# Patient Record
Sex: Female | Born: 1985 | Race: White | Hispanic: Yes | Marital: Married | Smoking: Never smoker
Health system: Southern US, Community
[De-identification: ages and names within clinical notes are randomized; demographics above are authoritative.]

## PROBLEM LIST (undated history)

## (undated) DIAGNOSIS — I10 Essential (primary) hypertension: Secondary | ICD-10-CM

## (undated) DIAGNOSIS — J45909 Unspecified asthma, uncomplicated: Secondary | ICD-10-CM

## (undated) HISTORY — PX: TONSILLECTOMY: SUR1361

## (undated) SURGICAL SUPPLY — 38 items
ADHESIVE MASTISOL STRL (MISCELLANEOUS) ×2
APPLIER CLIP ROT 10 11.4 M/L (STAPLE) ×3
BLADE SURG SZ11 CARB STEEL (BLADE) ×2
CANISTER SUCT 1200ML W/VALVE (MISCELLANEOUS) ×2
CATH CHOLANGI 4FR 420404F (CATHETERS)
CHLORAPREP W/TINT 26ML (MISCELLANEOUS) ×2
CLOSURE WOUND 1/2 X4 (GAUZE/BANDAGES/DRESSINGS) ×1
CONRAY 60ML FOR OR (MISCELLANEOUS)
DRAPE C-ARM XRAY 36X54 (DRAPES)
ELECT REM PT RETURN 9FT ADLT (ELECTROSURGICAL) ×3
ENDOPOUCH RETRIEVER 10 (MISCELLANEOUS) ×2
GLOVE BIO SURGEON STRL SZ8 (GLOVE) ×4
GLOVE EXAM NITRILE PF MED BLUE (GLOVE) ×4
GLOVE PROTEXIS LATEX SZ 7.5 (GLOVE) ×3 IMPLANT
GOWN STRL REUS W/TWL LRG LVL3 (GOWN DISPOSABLE) ×6
IRRIGATOR STRYKERFLOW (MISCELLANEOUS) ×3
IV CATH ANGIO 12GX3 LT BLUE (NEEDLE)
IV NS 1000ML (IV SOLUTION)
JACKSON PRATT 10 (INSTRUMENTS)
KIT RM TURNOVER STRD PROC AR (KITS) ×2
LABEL OR SOLS (LABEL) ×2
NDL SAFETY 22GX1.5 (NEEDLE) ×2
NEEDLE VERESS 14GA 120MM (NEEDLE) ×2
NS IRRIG 500ML POUR BTL (IV SOLUTION) ×2
PACK LAP CHOLECYSTECTOMY (MISCELLANEOUS) ×2
SCISSORS METZENBAUM CVD 33 (INSTRUMENTS) ×2
SLEEVE ENDOPATH XCEL 5M (ENDOMECHANICALS) ×4
SPONGE EXCIL AMD DRAIN 4X4 6P (MISCELLANEOUS)
SPONGE LAP 18X18 5 PK (GAUZE/BANDAGES/DRESSINGS)
SPONGE VERSALON 2X2 STRL (MISCELLANEOUS) ×8
STRIP CLOSURE SKIN 1/2X4 (GAUZE/BANDAGES/DRESSINGS) ×1
SUT MNCRL 4-0 (SUTURE) ×2
SUT MNCRL 4-0 27XMFL (SUTURE) ×1
SUT VICRYL 0 AB UR-6 (SUTURE) ×2
SYR 20CC LL (SYRINGE) ×2
TROCAR XCEL NON-BLD 11X100MML (ENDOMECHANICALS) ×2
TROCAR XCEL NON-BLD 5MMX100MML (ENDOMECHANICALS) ×2
TUBING INSUFFLATOR HI FLOW (MISCELLANEOUS) ×2

## (undated) SURGICAL SUPPLY — 24 items
BLADE SURG SZ11 CARB STEEL (BLADE) ×2
CHLORAPREP W/TINT 26ML (MISCELLANEOUS) ×2
DRAPE LEGGINS SURG 28X43 STRL (DRAPES) ×2
DRAPE UNDER BUTTOCK W/FLU (DRAPES) ×2
DRSG TEGADERM 2-3/8X2-3/4 SM (GAUZE/BANDAGES/DRESSINGS) ×4
GLOVE PI ORTHOPRO 6.5 (GLOVE) ×2
GLOVE SURG SYN 6.5 ES PF (GLOVE) ×3 IMPLANT
GOWN STRL REUS W/TWL LRG LVL3 (GOWN DISPOSABLE) ×2
GOWN STRL REUS W/TWL XL LVL3 (GOWN DISPOSABLE) ×2
LABEL OR SOLS (LABEL) ×2
LIGASURE BLUNT 5MM 37CM (INSTRUMENTS) ×2
LIQUID BAND (GAUZE/BANDAGES/DRESSINGS) ×2
NS IRRIG 500ML POUR BTL (IV SOLUTION) ×2
PACK LAP CHOLECYSTECTOMY (MISCELLANEOUS) ×2
PAD OB MATERNITY 4.3X12.25 (PERSONAL CARE ITEMS) ×2
PAD PREP 24X41 OB/GYN DISP (PERSONAL CARE ITEMS) ×4
SLEEVE ENDOPATH XCEL 5M (ENDOMECHANICALS) ×2
SPONGE VERSALON 2X2 STRL (MISCELLANEOUS) ×2
STRAP SAFETY BODY (MISCELLANEOUS) ×2
SUT MNCRL 4-0 (SUTURE) ×2
SUT MNCRL 4-0 27XMFL (SUTURE) ×1
SUT VIC AB 2-0 UR6 27 (SUTURE) ×2
TROCAR XCEL NON-BLD 5MMX100MML (ENDOMECHANICALS) ×2
TUBING INSUFFLATOR HI FLOW (MISCELLANEOUS) ×2

---

## 2009-01-03 IMAGING — CT CT ABD-PELV W/ CM
1 of 2 series · 16 of 32 positions shown, 20 images · IV contrast (agent unspecified)
Comparison: none

REASON FOR EXAM: RLQ pain
COMMENTS:

PROCEDURE:     CT  - CT ABDOMEN / PELVIS  W  - May 27, 2007  [DATE]
RESULT:     Axial images from the hemidiaphragms to the pubic symphysis are
obtained with contrast.

[Series 2: soft tissue · axial · 0.59mm/px · z∈[-457,-28]mm · 16 of 155 slices shown, 20 images]
[im 6/155  soft-tissue]
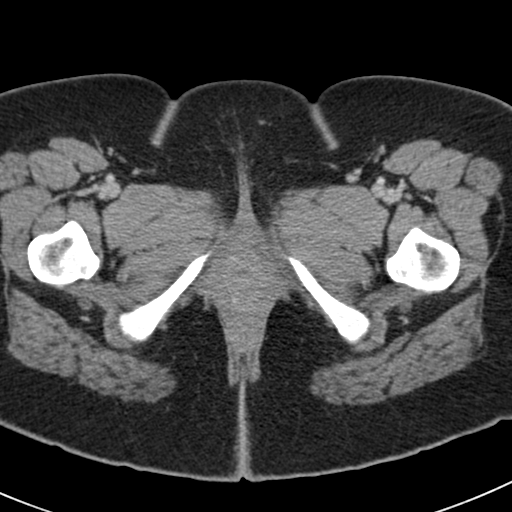
[im 6/155  bone]
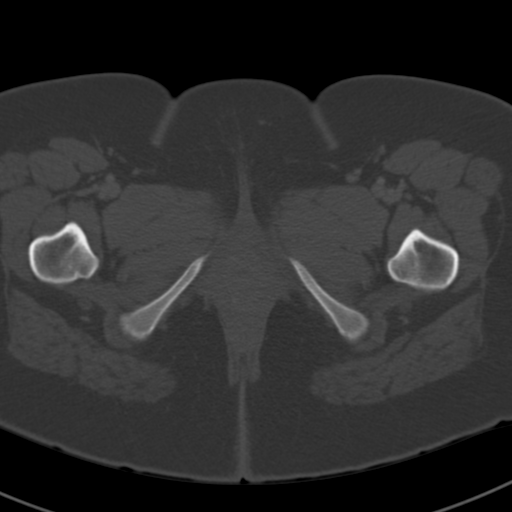
[im 18/155  soft-tissue]
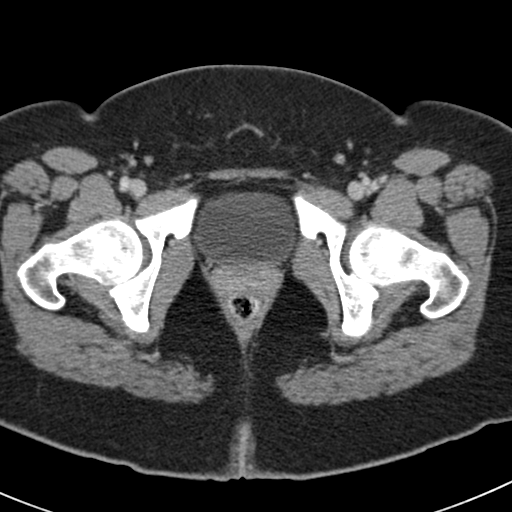
[im 29/155  soft-tissue]
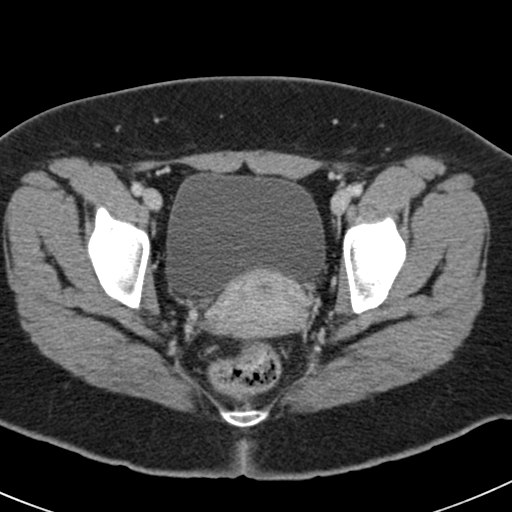
[im 40/155  soft-tissue]
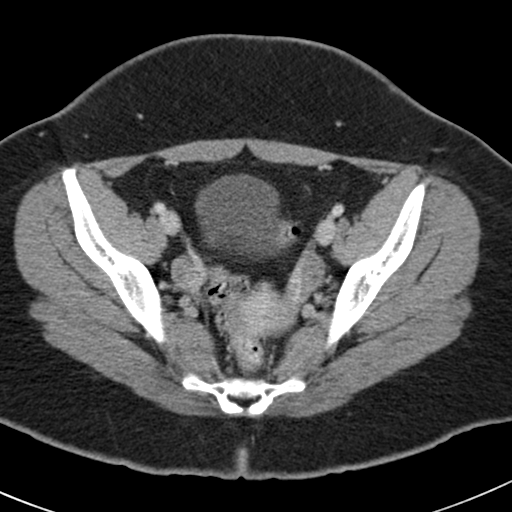
[im 52/155  soft-tissue]
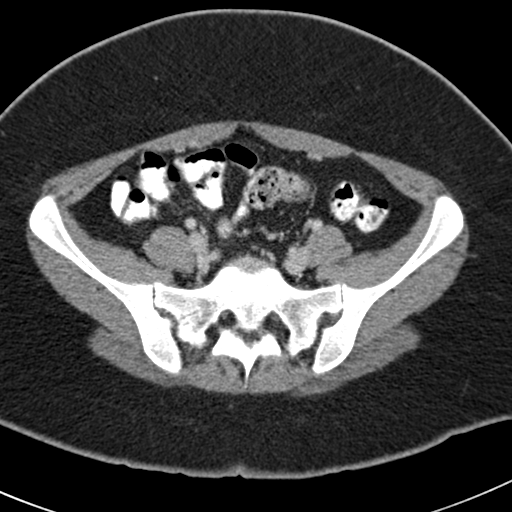
[im 63/155  soft-tissue]
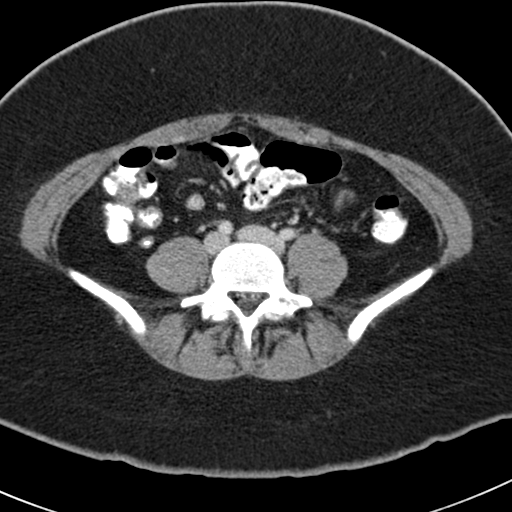
[im 75/155  soft-tissue]
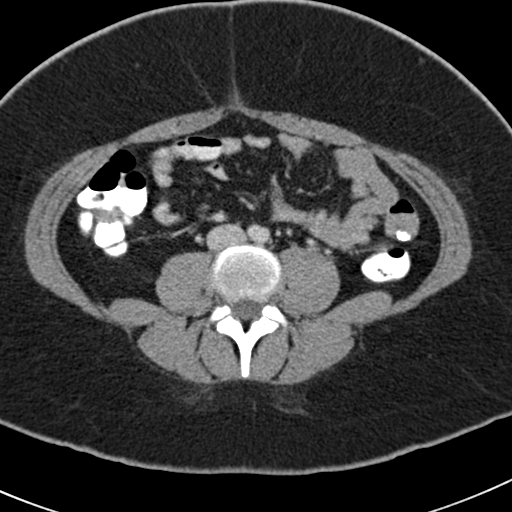
[im 80/155  soft-tissue]
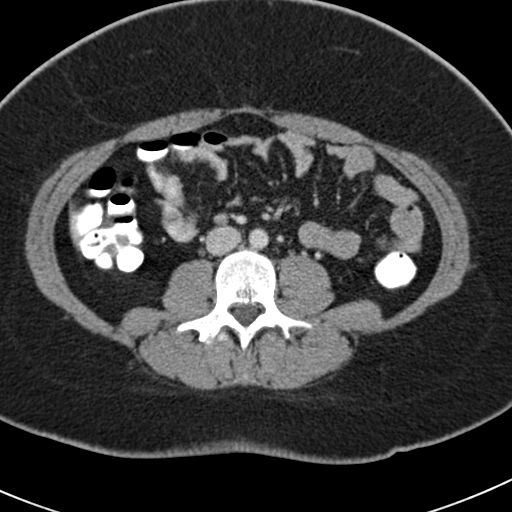
[im 92/155  soft-tissue]
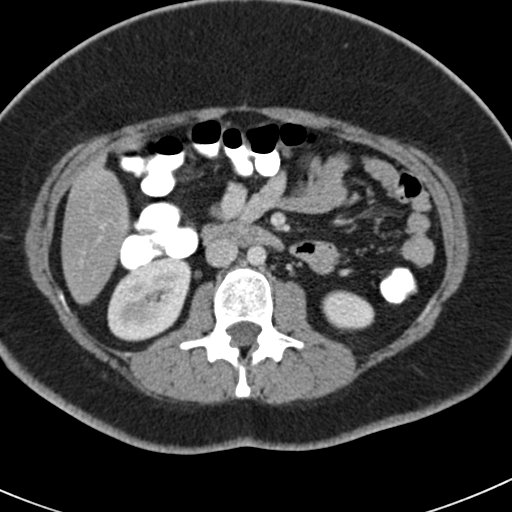
[im 92/155  bone]
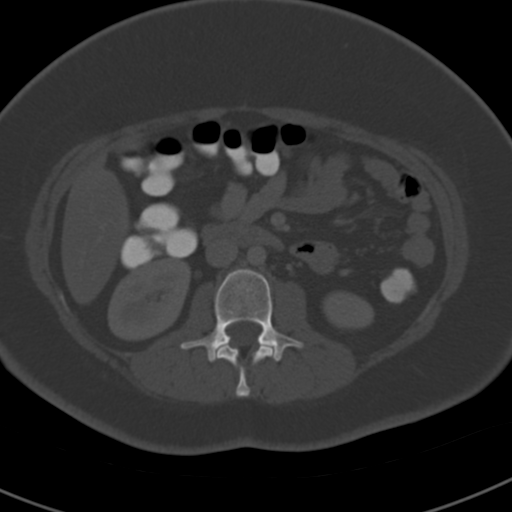
[im 103/155  soft-tissue]
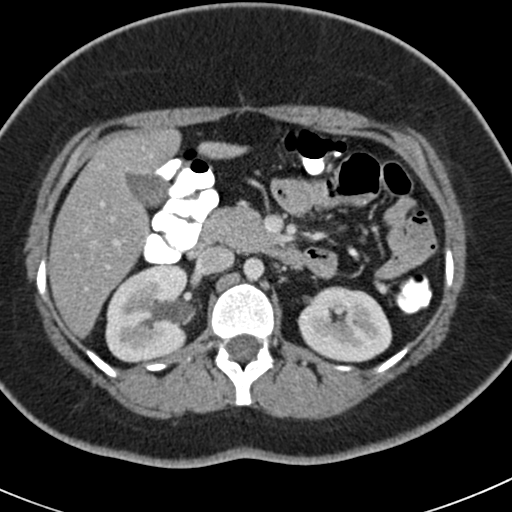
[im 115/155  soft-tissue]
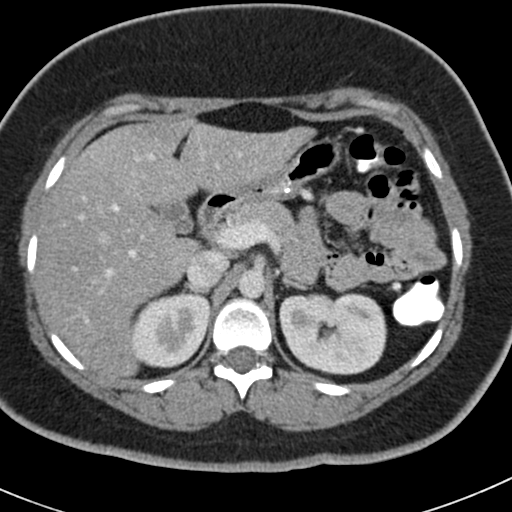
[im 126/155  soft-tissue]
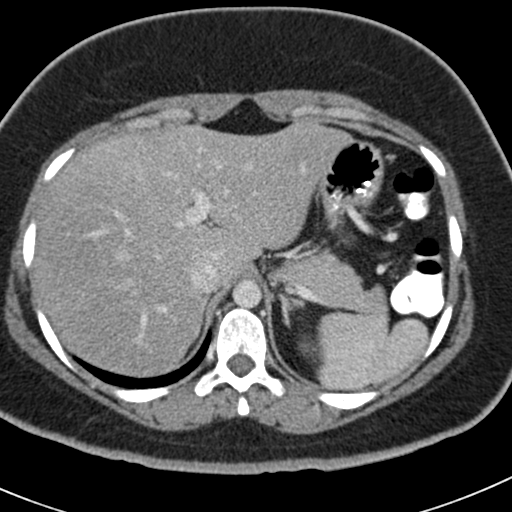
[im 132/155  lung]
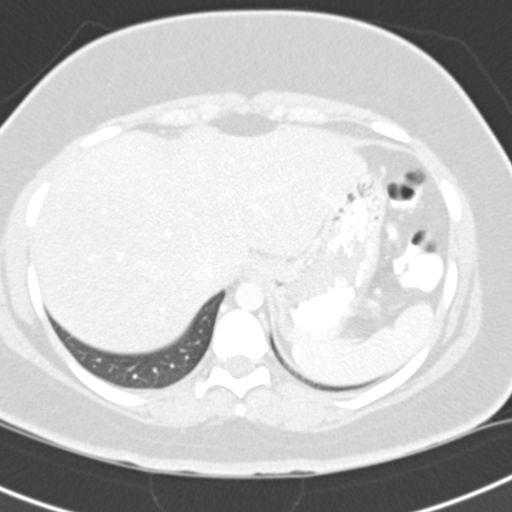
[im 137/155  soft-tissue]
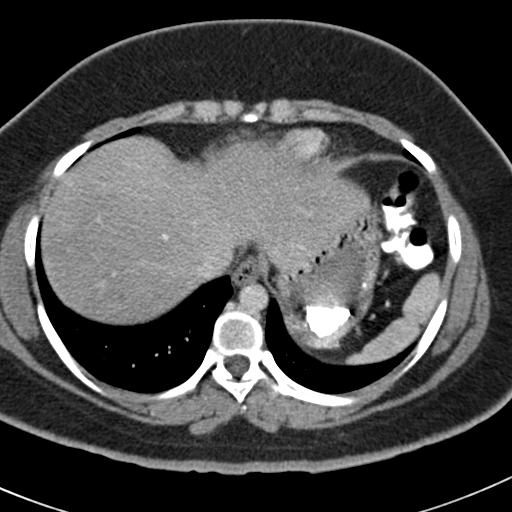
[im 137/155  lung]
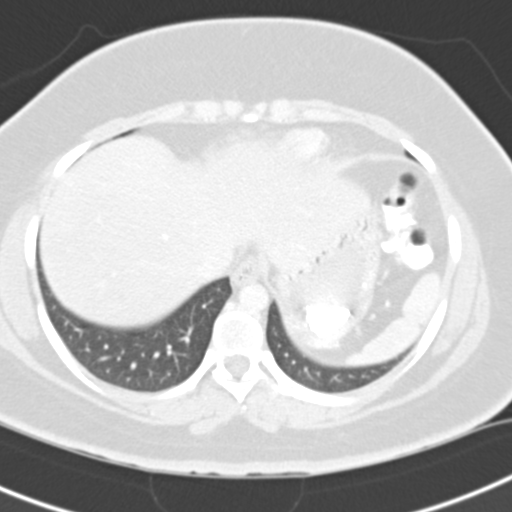
[im 143/155  lung]
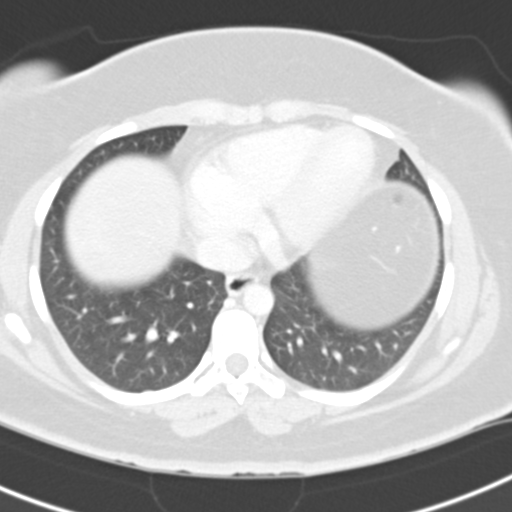
[im 149/155  soft-tissue]
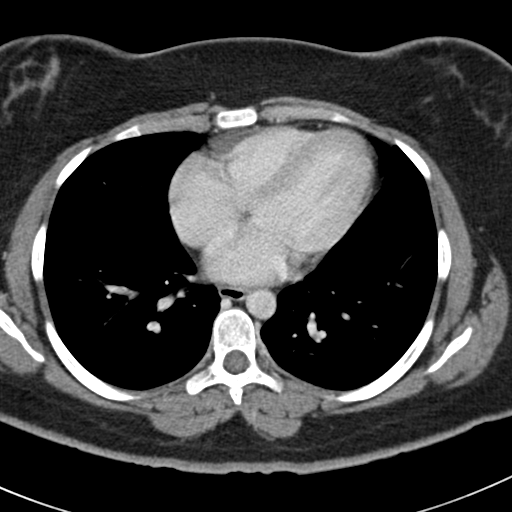
[im 149/155  lung]
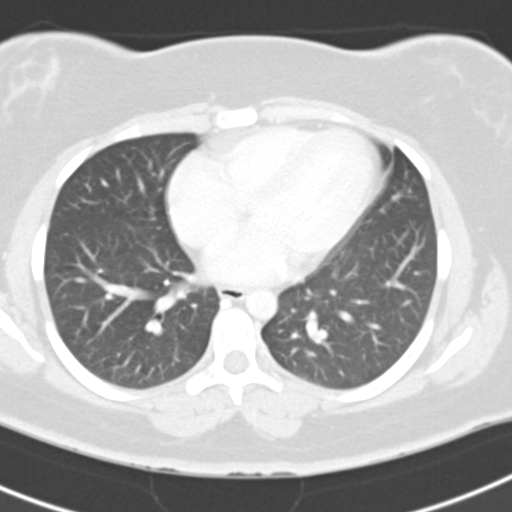

[16 of 32 positions shown; findings below may reference images not displayed]

FINDINGS: The liver and spleen are intact. Both kidneys excrete the contrast
and are intact. No hydronephrosis is seen. No ascites is present. No
appendicitis is identified. There is no evidence of diverticulitis. There
are small cysts in the ovaries bilaterally.
IMPRESSION: 1.     No evidence of appendicitis.
2.     Bilateral, small ovarian cysts.
3.     No additional abnormalities are identified.

## 2016-04-21 DIAGNOSIS — N939 Abnormal uterine and vaginal bleeding, unspecified: Secondary | ICD-10-CM

## 2016-05-07 DIAGNOSIS — N83201 Unspecified ovarian cyst, right side: Secondary | ICD-10-CM | POA: Insufficient documentation

## 2016-05-07 DIAGNOSIS — N939 Abnormal uterine and vaginal bleeding, unspecified: Secondary | ICD-10-CM | POA: Diagnosis not present

## 2016-06-24 DIAGNOSIS — R7989 Other specified abnormal findings of blood chemistry: Secondary | ICD-10-CM | POA: Diagnosis not present

## 2016-06-24 DIAGNOSIS — K8020 Calculus of gallbladder without cholecystitis without obstruction: Secondary | ICD-10-CM

## 2016-06-24 DIAGNOSIS — E119 Type 2 diabetes mellitus without complications: Secondary | ICD-10-CM | POA: Insufficient documentation

## 2016-06-24 DIAGNOSIS — K811 Chronic cholecystitis: Secondary | ICD-10-CM | POA: Diagnosis not present

## 2016-06-24 DIAGNOSIS — R1011 Right upper quadrant pain: Secondary | ICD-10-CM | POA: Diagnosis present

## 2016-06-24 DIAGNOSIS — Z793 Long term (current) use of hormonal contraceptives: Secondary | ICD-10-CM | POA: Diagnosis not present

## 2016-06-24 DIAGNOSIS — Z9889 Other specified postprocedural states: Secondary | ICD-10-CM | POA: Insufficient documentation

## 2016-06-24 DIAGNOSIS — R945 Abnormal results of liver function studies: Secondary | ICD-10-CM | POA: Insufficient documentation

## 2016-06-24 DIAGNOSIS — K810 Acute cholecystitis: Secondary | ICD-10-CM | POA: Diagnosis present

## 2016-06-24 HISTORY — DX: Essential (primary) hypertension: I10

## 2016-06-24 MED ORDER — ONDANSETRON HCL 4 MG PO TABS
4.0000 mg | ORAL_TABLET | Freq: Four times a day (QID) | ORAL | Status: DC | PRN
Start: 2016-06-24 — End: 2016-06-27

## 2016-06-24 MED ORDER — ONDANSETRON HCL 4 MG/2ML IJ SOLN
4.0000 mg | Freq: Four times a day (QID) | INTRAMUSCULAR | Status: DC | PRN
Start: 2016-06-24 — End: 2016-06-27
  Administered 2016-06-25 – 2016-06-27 (×2): 4 mg via INTRAVENOUS

## 2016-06-24 MED ADMIN — Lactated Ringer's Solution: INTRAVENOUS | @ 17:00:00 | NDC 00338011704

## 2016-06-24 MED ADMIN — AMPICILLIN-SULBACTAM 3 GM IVPB: 3 g | INTRAVENOUS | NDC 63323036920

## 2016-06-24 MED ADMIN — Heparin Sodium (Porcine) Inj 5000 Unit/ML: 5000 [IU] | SUBCUTANEOUS | NDC 25021040201

## 2016-06-24 MED ADMIN — Acetaminophen Tab 325 MG: 650 mg | ORAL | NDC 50580050110

## 2016-06-25 DIAGNOSIS — K810 Acute cholecystitis: Secondary | ICD-10-CM | POA: Diagnosis not present

## 2016-06-25 HISTORY — PX: CHOLECYSTECTOMY: SHX55

## 2016-06-25 MED ADMIN — Lactated Ringer's Solution: INTRAVENOUS | @ 09:00:00 | NDC 00338011704

## 2016-06-25 MED ADMIN — Lactated Ringer's Solution: INTRAVENOUS | @ 01:00:00 | NDC 00338011704

## 2016-06-25 MED ADMIN — Lactated Ringer's Solution: INTRAVENOUS | @ 22:00:00 | NDC 00338011704

## 2016-06-25 MED ADMIN — Lactated Ringer's Solution: INTRAVENOUS | @ 12:00:00 | NDC 00338011704

## 2016-06-25 MED ADMIN — AMPICILLIN-SULBACTAM 3 GM IVPB: 3 g | INTRAVENOUS | NDC 63323036920

## 2016-06-25 MED ADMIN — Heparin Sodium (Porcine) Inj 5000 Unit/ML: 5000 [IU] | SUBCUTANEOUS | NDC 25021040201

## 2016-06-25 MED ADMIN — Hydromorphone HCl Inj 1 MG/ML: 1 mg | INTRAVENOUS | NDC 00409128303

## 2016-06-25 MED ADMIN — Bupivacaine Inj 0.25% w/ Epinephrine 1:200000 (PF): 30 mL | PERINEURAL | NDC 00409904217

## 2016-06-25 MED ADMIN — Fentanyl Citrate Preservative Free (PF) Inj 100 MCG/2ML: 100 ug | INTRAVENOUS | NDC 00409909422

## 2016-06-25 MED ADMIN — Fentanyl Citrate Preservative Free (PF) Inj 100 MCG/2ML: 50 ug | INTRAVENOUS | NDC 00409909422

## 2016-06-25 MED ADMIN — Acetaminophen Tab 325 MG: 650 mg | ORAL | NDC 50580050110

## 2016-06-25 MED ADMIN — Glycopyrrolate Inj 0.2 MG/ML: 0.6 mg | INTRAVENOUS | NDC 00143968225

## 2016-06-25 MED ADMIN — Rocuronium Bromide IV Soln 100 MG/10ML (10 MG/ML): 30 mg | INTRAVENOUS | NDC 67457022810

## 2016-06-25 MED ADMIN — Rocuronium Bromide IV Soln 100 MG/10ML (10 MG/ML): 10 mg | INTRAVENOUS | NDC 67457022810

## 2016-06-25 MED ADMIN — Midazolam HCl Inj 2 MG/2ML (Base Equivalent): 2 mg | INTRAVENOUS | NDC 00409230521

## 2016-06-25 MED ADMIN — PROPOFOL 200 MG/20ML IV EMUL: 40 mg | INTRAVENOUS | NDC 63323026929

## 2016-06-25 MED ADMIN — Neostigmine Methylsulfate IV Soln 10 MG/10 ML (1 MG/ML): 4 mg | INTRAVENOUS | NDC 63323041510

## 2016-06-25 MED ADMIN — Dexamethasone Sodium Phosphate Inj 10 MG/ML: 8 mg | INTRAVENOUS | NDC 00641036725

## 2016-06-25 MED ADMIN — Lidocaine HCl IV Inj 20 MG/ML: 40 mg | INTRAVENOUS | NDC 00409132305

## 2016-06-26 MED ADMIN — Lactated Ringer's Solution: INTRAVENOUS | @ 08:00:00 | NDC 00338011704

## 2016-06-26 MED ADMIN — AMPICILLIN-SULBACTAM 3 GM IVPB: 3 g | INTRAVENOUS | NDC 63323036920

## 2016-06-26 MED ADMIN — Heparin Sodium (Porcine) Inj 5000 Unit/ML: 5000 [IU] | SUBCUTANEOUS | NDC 63323026201

## 2016-06-26 MED ADMIN — Heparin Sodium (Porcine) Inj 5000 Unit/ML: 5000 [IU] | SUBCUTANEOUS | NDC 25021040201

## 2016-06-26 MED ADMIN — Hydromorphone HCl Inj 1 MG/ML: 1 mg | INTRAVENOUS | NDC 00409128303

## 2016-06-26 MED ADMIN — Hydromorphone HCl Inj 1 MG/ML: 1 mg | INTRAVENOUS | NDC 00409128331

## 2016-06-26 MED ADMIN — Oxycodone w/ Acetaminophen Tab 5-325 MG: 1 | ORAL | NDC 00406051223

## 2016-06-27 MED ADMIN — AMPICILLIN-SULBACTAM 3 GM IVPB: 3 g | INTRAVENOUS | NDC 63323036920

## 2016-06-27 MED ADMIN — Heparin Sodium (Porcine) Inj 5000 Unit/ML: 5000 [IU] | SUBCUTANEOUS | NDC 25021040201

## 2016-06-27 MED ADMIN — Oxycodone w/ Acetaminophen Tab 5-325 MG: 1 | ORAL | NDC 00406051223

## 2016-06-27 MED ADMIN — Ibuprofen Tab 600 MG: 600 mg | ORAL | NDC 63739068410

## 2016-06-27 MED ADMIN — Promethazine HCl Suppos 25 MG: 25 mg | RECTAL | NDC 00591299239

## 2016-07-13 VITALS — BP 147/90 | HR 74 | Temp 99.1°F | Wt 188.0 lb

## 2016-07-13 DIAGNOSIS — K8000 Calculus of gallbladder with acute cholecystitis without obstruction: Secondary | ICD-10-CM

## 2017-01-05 DIAGNOSIS — Z01818 Encounter for other preprocedural examination: Secondary | ICD-10-CM | POA: Insufficient documentation

## 2017-01-05 DIAGNOSIS — I10 Essential (primary) hypertension: Secondary | ICD-10-CM | POA: Insufficient documentation

## 2017-01-05 HISTORY — DX: Unspecified asthma, uncomplicated: J45.909

## 2017-01-15 DIAGNOSIS — Z302 Encounter for sterilization: Secondary | ICD-10-CM | POA: Diagnosis present

## 2017-01-15 DIAGNOSIS — N838 Other noninflammatory disorders of ovary, fallopian tube and broad ligament: Secondary | ICD-10-CM | POA: Insufficient documentation

## 2017-01-15 DIAGNOSIS — E119 Type 2 diabetes mellitus without complications: Secondary | ICD-10-CM | POA: Diagnosis not present

## 2017-01-15 DIAGNOSIS — I10 Essential (primary) hypertension: Secondary | ICD-10-CM | POA: Diagnosis not present

## 2017-01-15 HISTORY — PX: LAPAROSCOPIC TUBAL LIGATION: SHX1937

## 2017-01-15 MED ADMIN — Lidocaine HCl IV Inj 20 MG/ML: 100 mg | INTRAVENOUS | NDC 00409132305

## 2017-01-15 MED ADMIN — Famotidine Tab 20 MG: 20 mg | ORAL

## 2017-01-15 MED ADMIN — Phenylephrine HCl Inj 10 MG/ML: 200 ug | INTRAVENOUS | NDC 00641614225

## 2017-01-15 MED ADMIN — Ondansetron HCl Inj 4 MG/2ML (2 MG/ML): 4 mg | INTRAVENOUS | NDC 00409475503

## 2017-01-15 MED ADMIN — PROPOFOL 200 MG/20ML IV EMUL: 160 mg | INTRAVENOUS | NDC 63323026929

## 2017-01-15 MED ADMIN — Midazolam HCl Inj 2 MG/2ML (Base Equivalent): 2 mg | INTRAVENOUS | NDC 63323041112

## 2017-01-15 MED ADMIN — Fentanyl Citrate Preservative Free (PF) Inj 100 MCG/2ML: 50 ug | INTRAVENOUS | NDC 00409909422

## 2017-01-15 MED ADMIN — Bupivacaine HCl Preservative Free (PF) Inj 0.5%: 6 mL | NDC 00409116202

## 2017-01-15 MED ADMIN — Sodium Chloride IV Soln 0.9%: INTRAVENOUS | @ 09:00:00 | NDC 00338004904

## 2017-01-15 MED ADMIN — Ketorolac Tromethamine Inj 30 MG/ML: 30 mg | INTRAVENOUS | NDC 63323016201

## 2017-01-15 MED ADMIN — Acetaminophen IV Soln 10 MG/ML: 1000 mg | INTRAVENOUS | NDC 43825010201

## 2017-01-15 MED ADMIN — Dexamethasone Sodium Phosphate Inj 10 MG/ML: 10 mg | INTRAVENOUS | NDC 00641036725

## 2017-01-15 MED ADMIN — Sugammadex Sodium IV 200 MG/2ML (Base Equivalent): 160 mg | INTRAVENOUS | NDC 00006542312

## 2017-01-15 MED ADMIN — Rocuronium Bromide IV Soln 100 MG/10ML (10 MG/ML): 30 mg | INTRAVENOUS | NDC 67457022810

## 2017-01-15 MED ADMIN — Famotidine Tab 20 MG: 20 mg | ORAL | NDC 51079096601

## 2017-01-15 MED ADMIN — Oxycodone HCl Tab 5 MG: 5 mg | ORAL | NDC 00406055223

## 2017-05-14 IMAGING — US US PELVIS COMPLETE
1 series · 13 of 25 positions shown · non-contrast
Comparison: None

CLINICAL DATA: Uterine bleeding.

EXAM:
TRANSABDOMINAL AND TRANSVAGINAL ULTRASOUND OF PELVIS
TECHNIQUE: Both transabdominal and transvaginal ultrasound examinations of the
pelvis were performed. Transabdominal technique was performed for
global imaging of the pelvis including uterus, ovaries, adnexal
regions, and pelvic cul-de-sac. It was necessary to proceed with
endovaginal exam following the transabdominal exam to visualize the
uterus and ovaries.

[Series 1: us pelvis complete · 0.24mm/px · 13 of 101 slices shown]
[im 1/101]
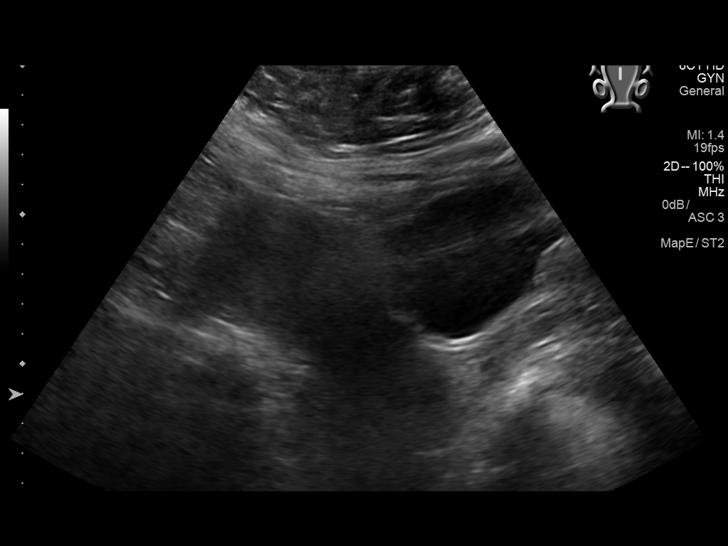
[im 9/101]
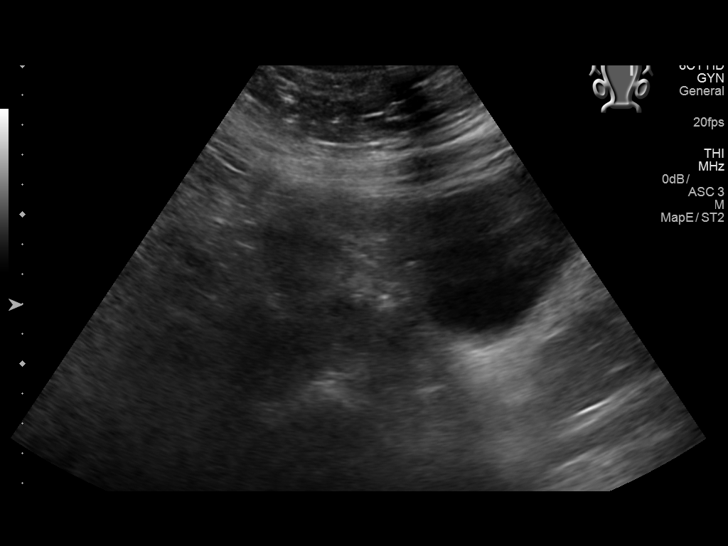
[im 17/101]
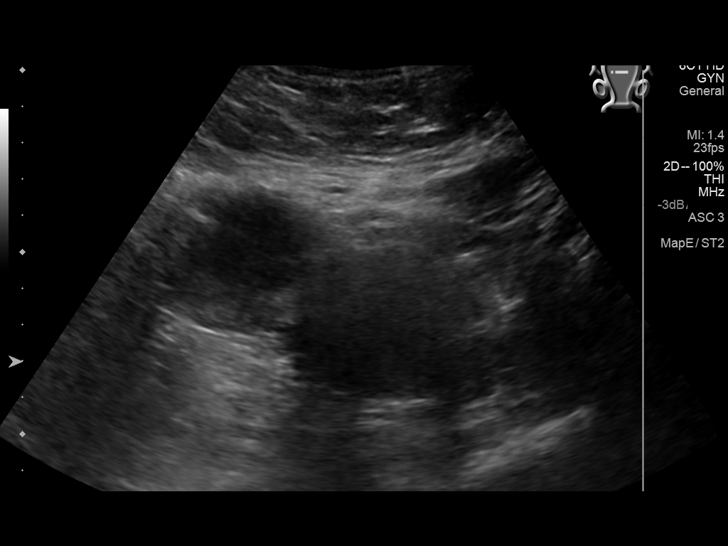
[im 26/101]
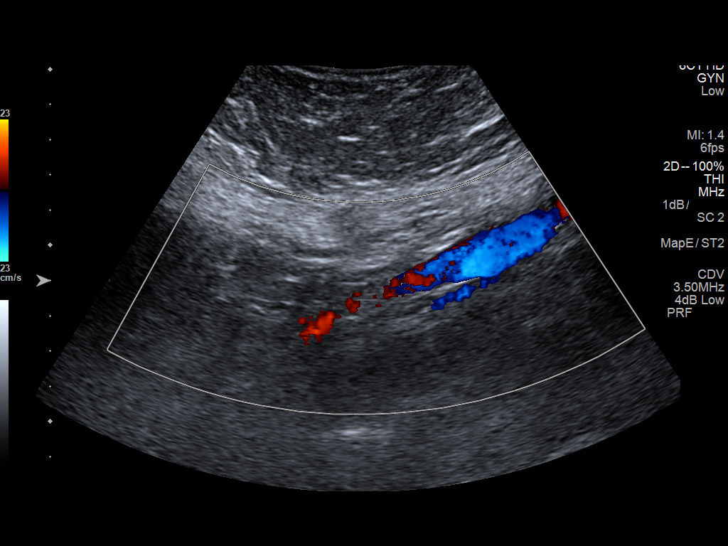
[im 34/101]
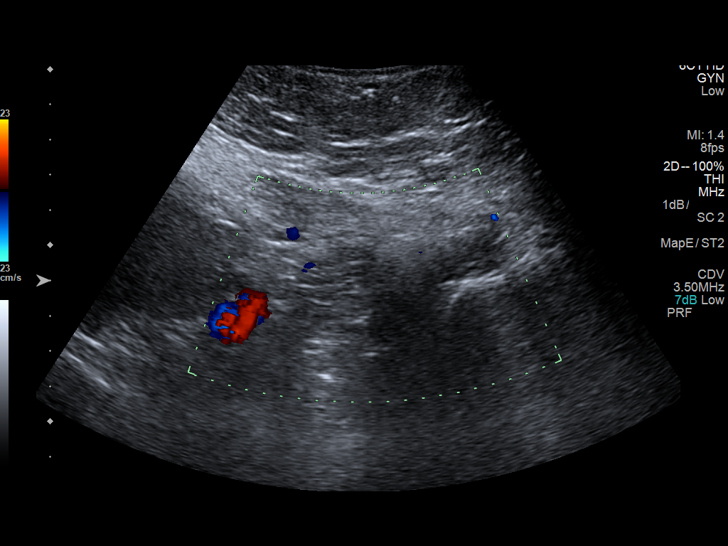
[im 42/101]
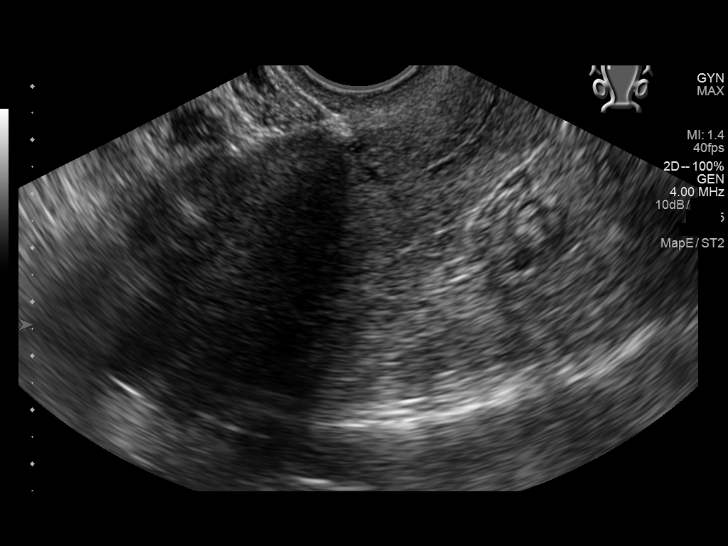
[im 51/101]
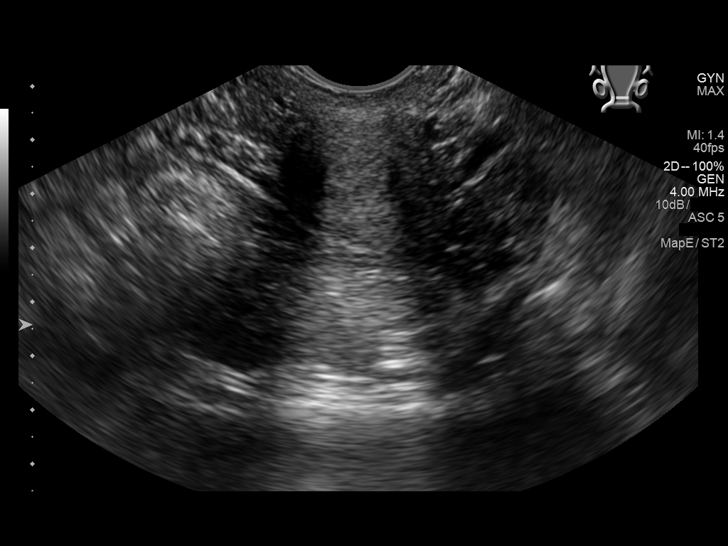
[im 59/101]
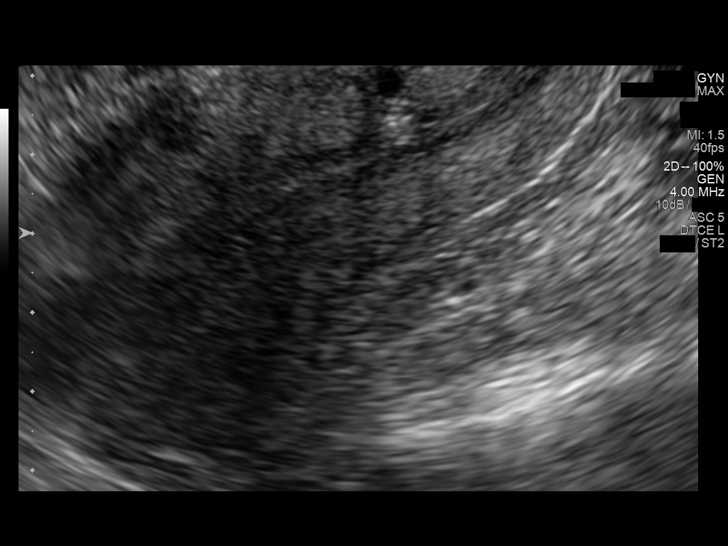
[im 67/101]
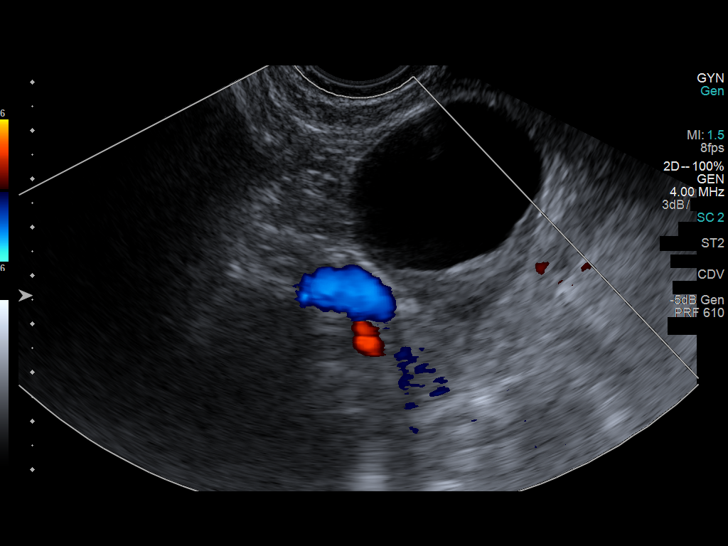
[im 76/101]
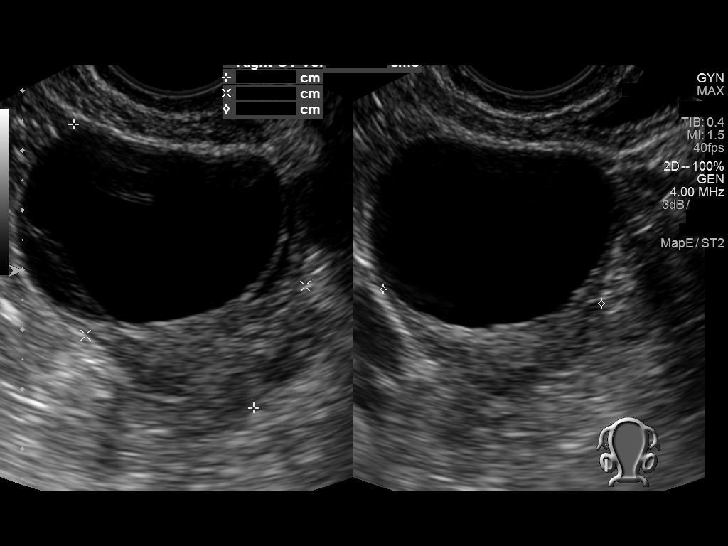
[im 84/101]
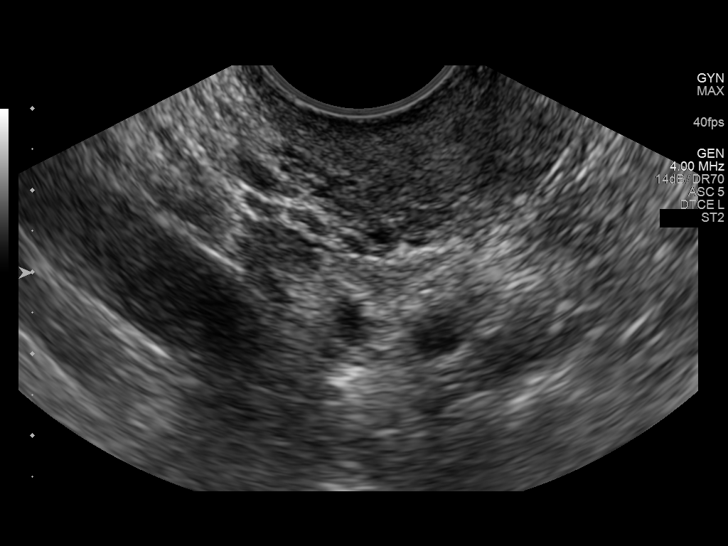
[im 92/101]
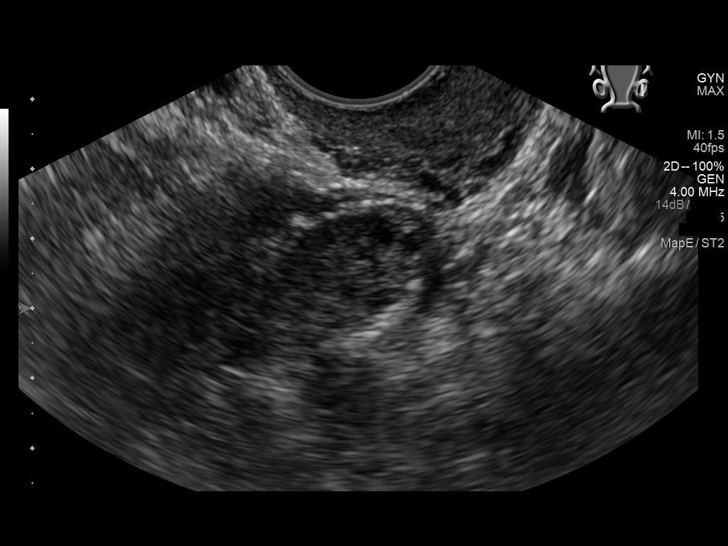
[im 101/101]
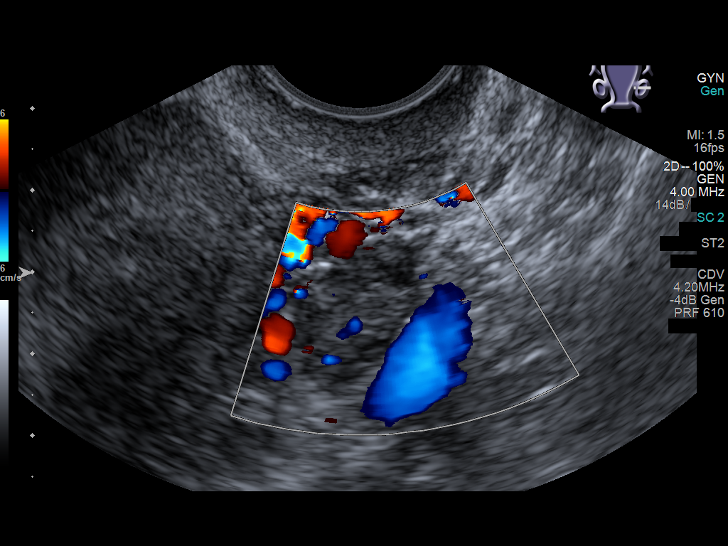

[13 of 25 positions shown; findings below may reference images not displayed]

FINDINGS: Uterus

Measurements: 9.7 x 3.9 x 5.3 cm. No fibroids or other mass
visualized. Minimal amount of fluid noted cervical canal. This is
nonspecific.

Endometrium

Thickness: 8 mm.  No focal abnormality visualized.

Right ovary

Measurements: 5.6 x 3.8 x 3.7 cm. 4.2 x 3.2 x 4.1 cm thinly septated
cyst with with faint internal echoes.

Left ovary

Measurements: 2.6 x 1.8 x 1.7 cm. Normal appearance/no adnexal mass.

Other findings

No abnormal free fluid.
IMPRESSION: 1. 4.2 x 3.2 x 4.1 cm thinly septated cyst with faint internal
echoes right ovary. Short-interval follow up ultrasound in 6-12
weeks is recommended, preferably during the week following the
patient's normal menses.

2. Minimal amount of fluid noted cervical canal. This is nonspecific
finding.

## 2017-07-01 IMAGING — US US ABDOMEN LIMITED
1 series · 14 of 25 positions shown · non-contrast
Comparison: CT 05/27/2007

CLINICAL DATA: Right upper quadrant abdominal pain

EXAM:
US ABDOMEN LIMITED - RIGHT UPPER QUADRANT

[Series 1: us abdomen limited · 0.28mm/px · 14 of 44 slices shown]
[im 1/44]
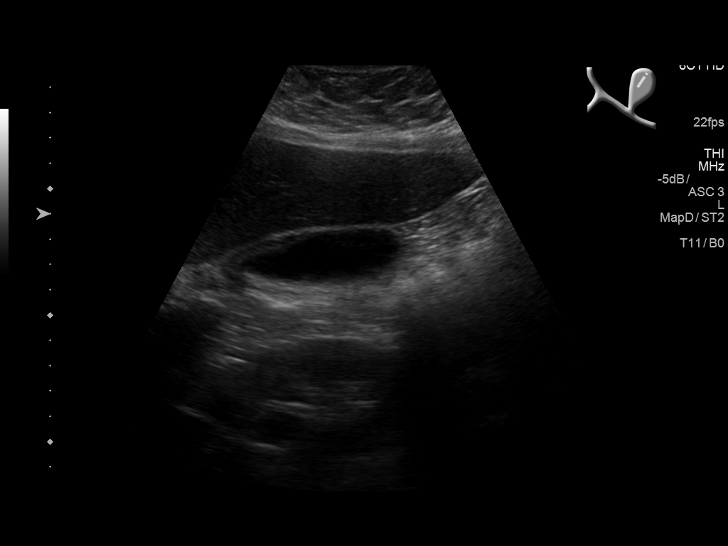
[im 4/44]
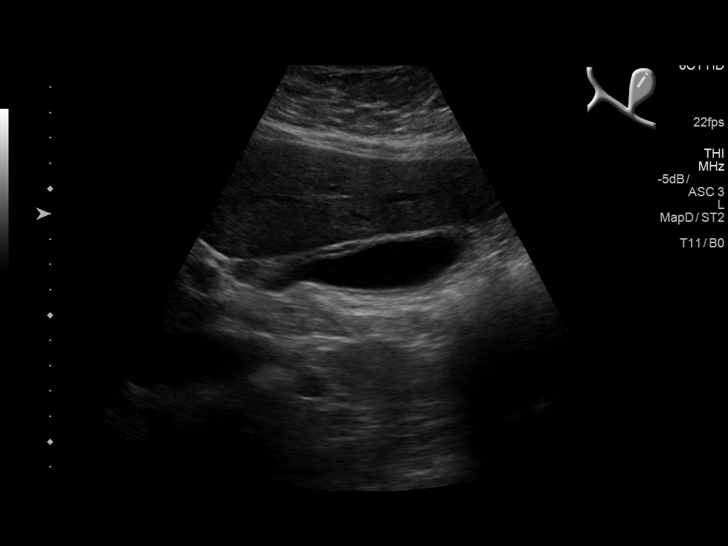
[im 8/44]
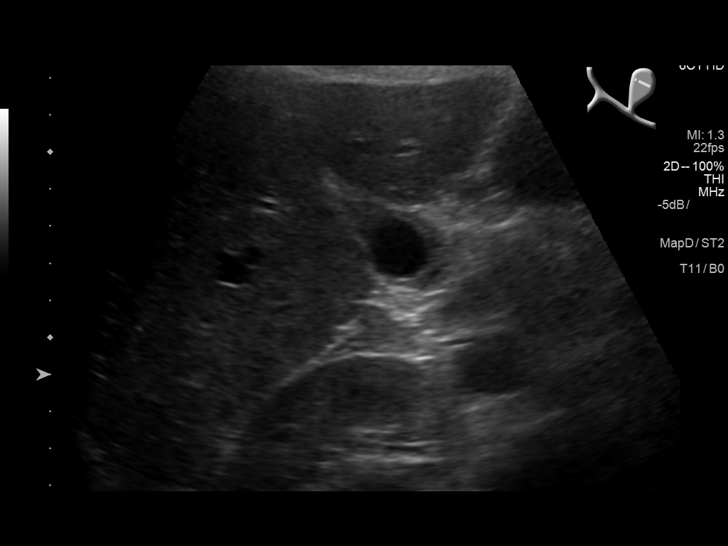
[im 11/44]
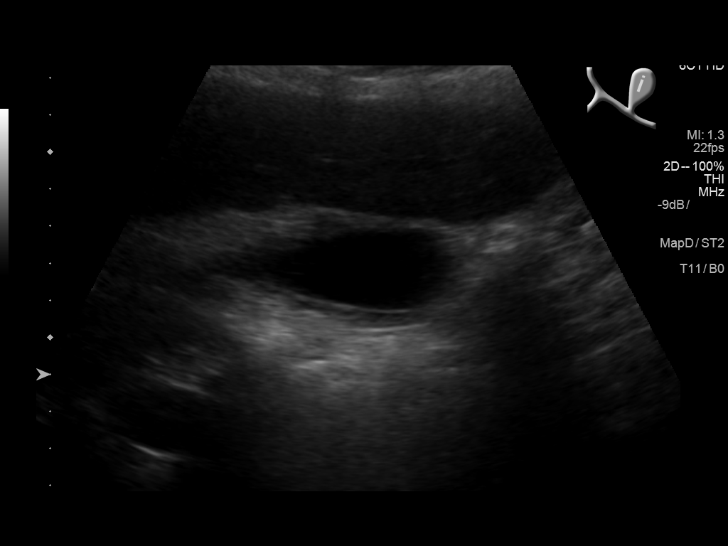
[im 15/44]
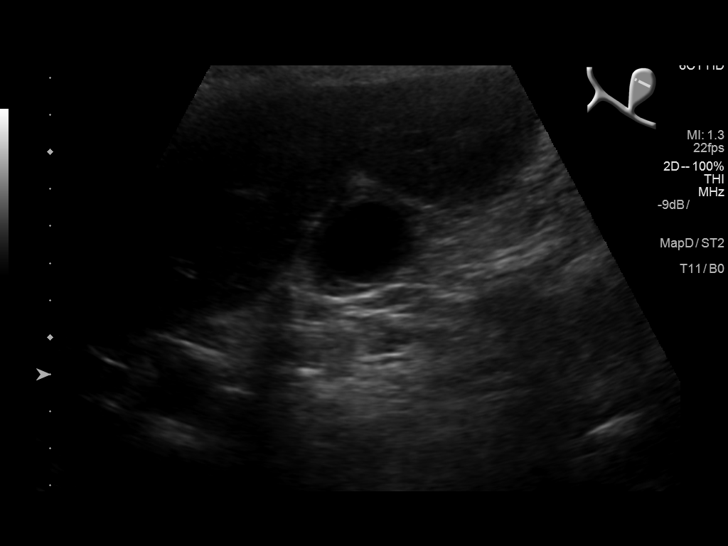
[im 17/44]
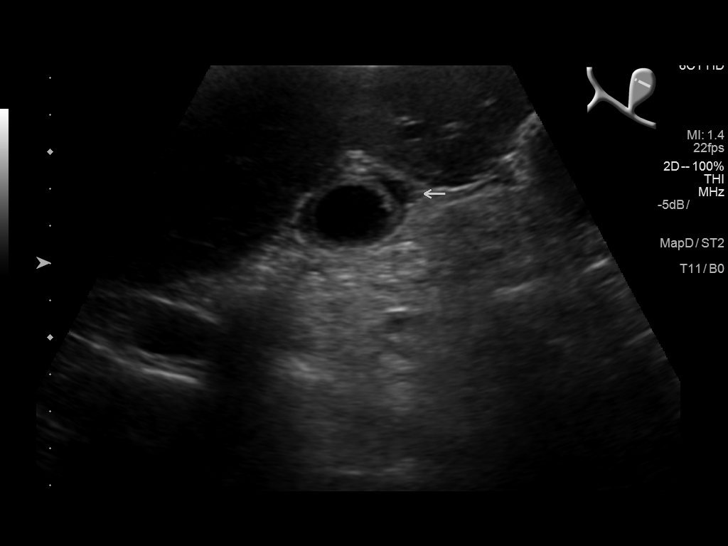
[im 20/44]
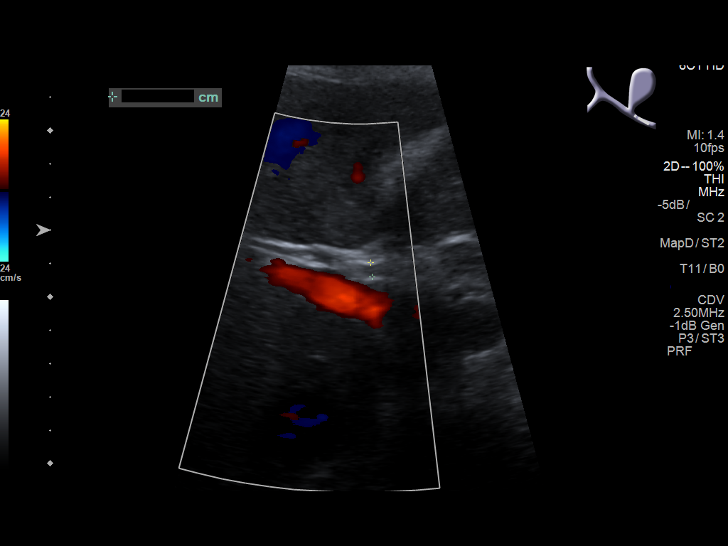
[im 24/44]
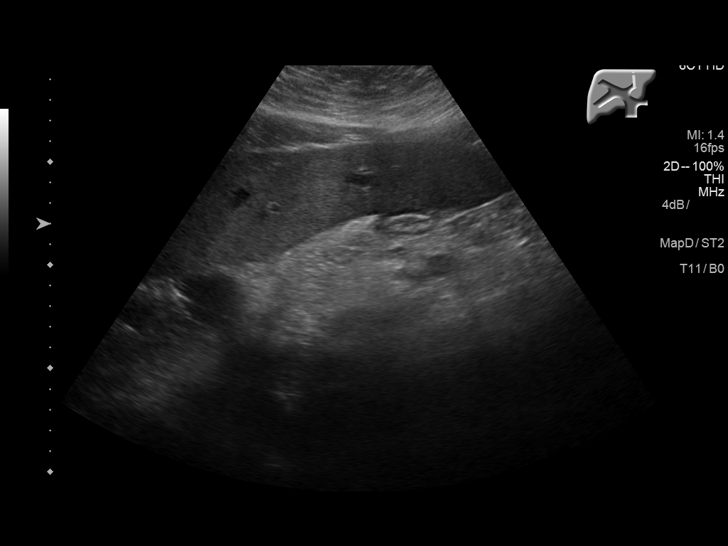
[im 27/44]
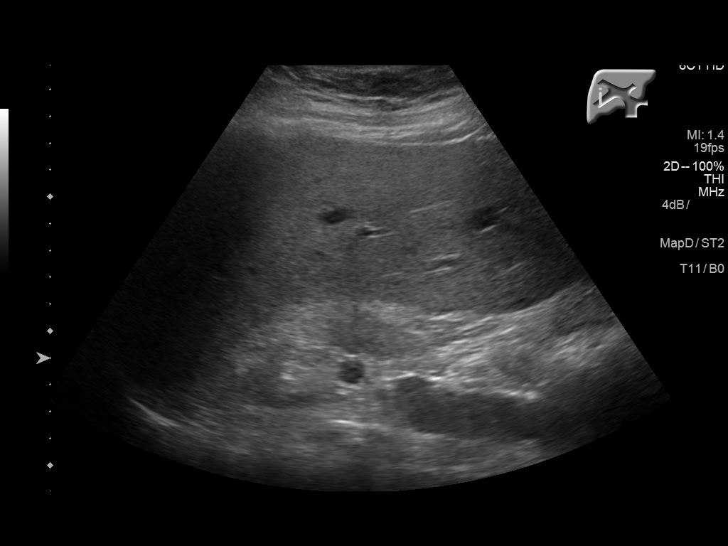
[im 29/44]
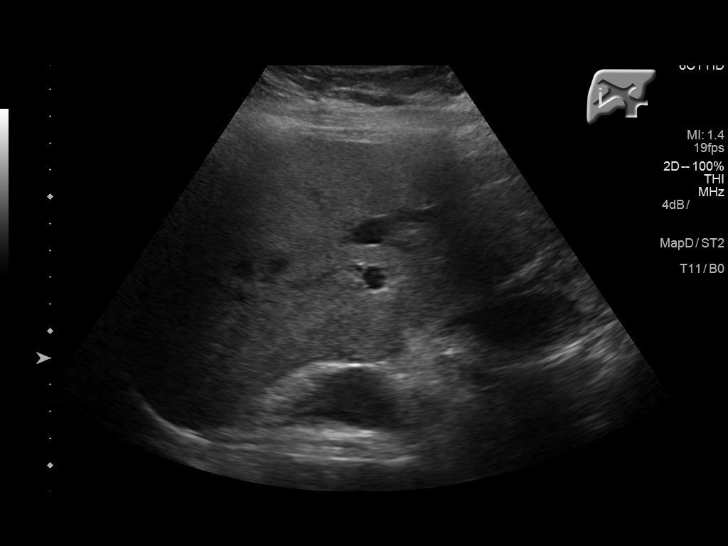
[im 33/44]
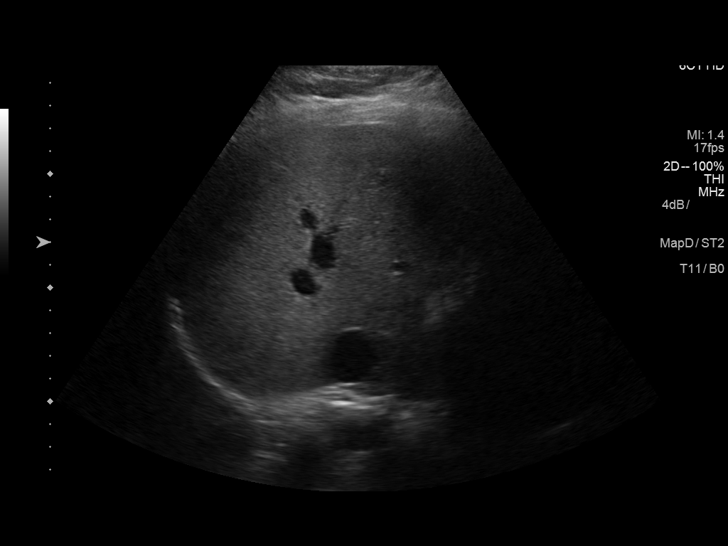
[im 36/44]
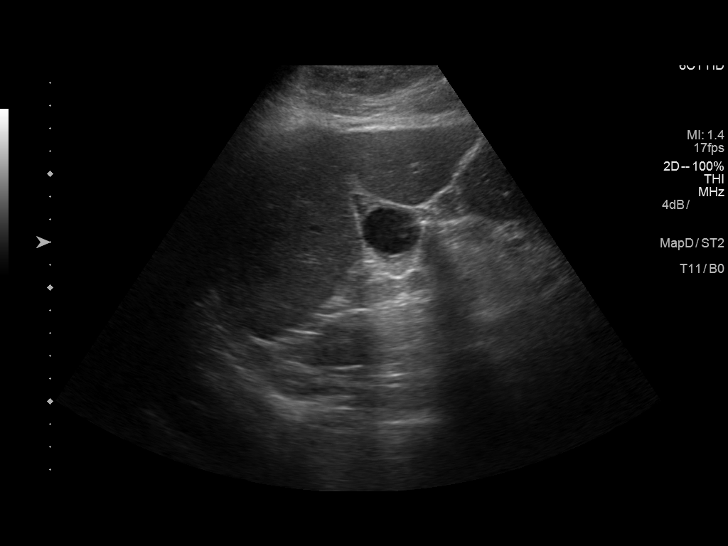
[im 40/44]
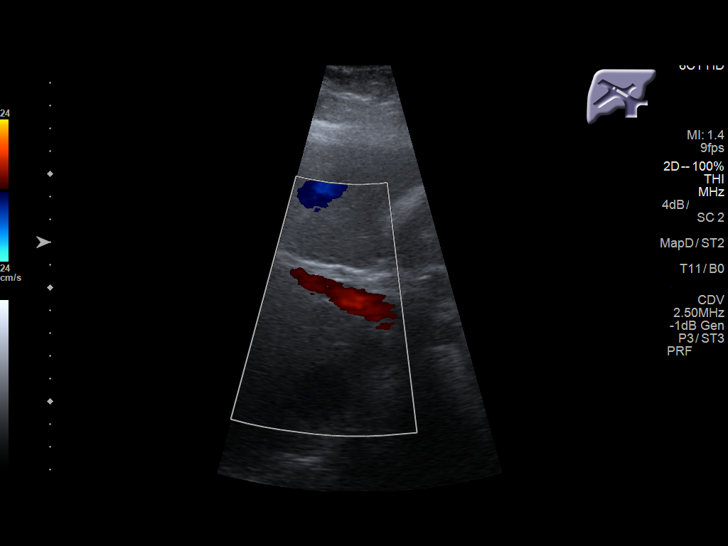
[im 44/44]
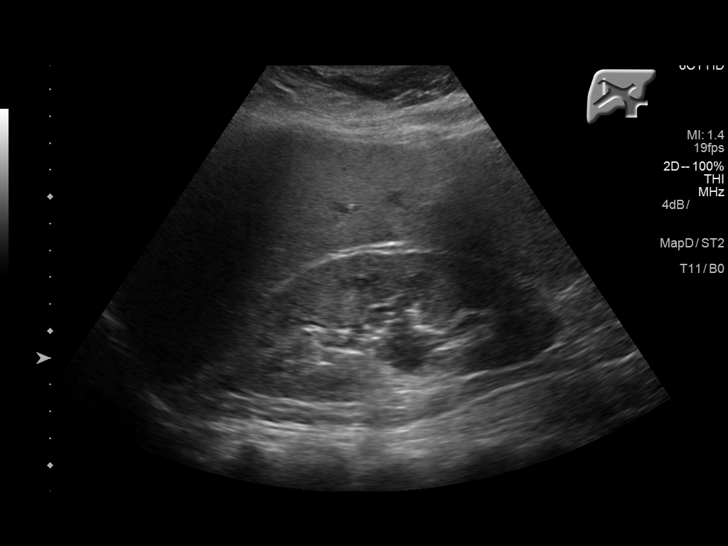

[14 of 25 positions shown; findings below may reference images not displayed]

FINDINGS: Gallbladder:

The gallbladder wall is diffusely thickened measuring 5.5 mm. There
is pericholecystic fluid an a positive sonographic Murphy's sign. No
stones identified.

Common bile duct:

Diameter: 4.3 mm

Liver:

No focal lesion identified. Within normal limits in parenchymal
echogenicity.
IMPRESSION: 1. Gallbladder wall thickening, pericholecystic fluid and positive
sonographic Murphy's sign identified on today's exam. Findings may
be consistent with acute cholecystitis. If further imaging is
clinically indicated a nuclear medicine hepatobiliary scan may be
helpful to assess patency of the cystic duct.

## 2018-09-19 DIAGNOSIS — Z79899 Other long term (current) drug therapy: Secondary | ICD-10-CM | POA: Diagnosis not present

## 2018-09-19 DIAGNOSIS — Y999 Unspecified external cause status: Secondary | ICD-10-CM | POA: Diagnosis not present

## 2018-09-19 DIAGNOSIS — I10 Essential (primary) hypertension: Secondary | ICD-10-CM | POA: Insufficient documentation

## 2018-09-19 DIAGNOSIS — R1011 Right upper quadrant pain: Secondary | ICD-10-CM | POA: Insufficient documentation

## 2018-09-19 DIAGNOSIS — Y92410 Unspecified street and highway as the place of occurrence of the external cause: Secondary | ICD-10-CM | POA: Diagnosis not present

## 2018-09-19 DIAGNOSIS — Y939 Activity, unspecified: Secondary | ICD-10-CM | POA: Diagnosis not present

## 2018-09-19 DIAGNOSIS — R0789 Other chest pain: Secondary | ICD-10-CM | POA: Diagnosis not present

## 2018-09-19 DIAGNOSIS — R51 Headache: Secondary | ICD-10-CM | POA: Insufficient documentation

## 2018-09-19 MED ADMIN — Ibuprofen Tab 600 MG: 600 mg | ORAL | NDC 63739068410

## 2018-09-19 MED ADMIN — Iopamidol IV Soln 61%: 100 mL | INTRAVENOUS | NDC 00270131535

## 2018-10-17 DIAGNOSIS — R109 Unspecified abdominal pain: Secondary | ICD-10-CM

## 2018-10-17 DIAGNOSIS — Z9049 Acquired absence of other specified parts of digestive tract: Secondary | ICD-10-CM | POA: Insufficient documentation

## 2018-10-17 DIAGNOSIS — J45909 Unspecified asthma, uncomplicated: Secondary | ICD-10-CM | POA: Insufficient documentation

## 2018-10-17 DIAGNOSIS — Z79899 Other long term (current) drug therapy: Secondary | ICD-10-CM | POA: Insufficient documentation

## 2018-10-17 DIAGNOSIS — I10 Essential (primary) hypertension: Secondary | ICD-10-CM | POA: Insufficient documentation

## 2018-10-17 DIAGNOSIS — N390 Urinary tract infection, site not specified: Secondary | ICD-10-CM

## 2018-10-17 MED ADMIN — Dicyclomine HCl Cap 10 MG: 20 mg | ORAL | NDC 60687036911

## 2018-10-17 MED ADMIN — CEFTRIAXONE  1 GM IVPB MIXTURE: 1 g | INTRAVENOUS | NDC 00781320885

## 2018-10-17 MED ADMIN — Phenazopyridine HCl Tab 200 MG: 200 mg | ORAL | NDC 42937070210

## 2019-09-11 DIAGNOSIS — Z20822 Contact with and (suspected) exposure to covid-19: Secondary | ICD-10-CM

## 2019-12-26 DIAGNOSIS — Z20822 Contact with and (suspected) exposure to covid-19: Secondary | ICD-10-CM
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
## 2024-08-20 DIAGNOSIS — R030 Elevated blood-pressure reading, without diagnosis of hypertension: Secondary | ICD-10-CM | POA: Diagnosis not present

## 2024-08-20 DIAGNOSIS — N3010 Interstitial cystitis (chronic) without hematuria: Secondary | ICD-10-CM | POA: Diagnosis not present

## 2024-08-20 DIAGNOSIS — R300 Dysuria: Secondary | ICD-10-CM | POA: Diagnosis not present

## 2024-08-20 DIAGNOSIS — R3915 Urgency of urination: Secondary | ICD-10-CM
